# Patient Record
Sex: Female | Born: 2000 | Race: Black or African American | Hispanic: No | Marital: Single
Health system: Southern US, Community
[De-identification: ages and names within clinical notes are randomized; demographics above are authoritative.]

---
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
## 2022-05-28 ENCOUNTER — Encounter (HOSPITAL_COMMUNITY): Payer: Self-pay | Admitting: Emergency Medicine

## 2022-05-28 ENCOUNTER — Emergency Department (HOSPITAL_COMMUNITY): Payer: Medicaid Other

## 2022-05-28 ENCOUNTER — Other Ambulatory Visit: Payer: Self-pay

## 2022-05-28 ENCOUNTER — Emergency Department (HOSPITAL_COMMUNITY)
Admission: EM | Admit: 2022-05-28 | Discharge: 2022-05-28 | Disposition: A | Discharge status: Home / Self Care | Payer: Medicaid Other | Attending: Emergency Medicine | Admitting: Emergency Medicine

## 2022-05-28 DIAGNOSIS — Z23 Encounter for immunization: Secondary | ICD-10-CM | POA: Diagnosis not present

## 2022-05-28 DIAGNOSIS — M79674 Pain in right toe(s): Secondary | ICD-10-CM | POA: Diagnosis not present

## 2022-05-28 DIAGNOSIS — S99921A Unspecified injury of right foot, initial encounter: Secondary | ICD-10-CM | POA: Diagnosis present

## 2022-05-28 DIAGNOSIS — S92424A Nondisplaced fracture of distal phalanx of right great toe, initial encounter for closed fracture: Secondary | ICD-10-CM | POA: Diagnosis not present

## 2022-05-28 DIAGNOSIS — Y92410 Unspecified street and highway as the place of occurrence of the external cause: Secondary | ICD-10-CM | POA: Diagnosis not present

## 2022-05-28 DIAGNOSIS — S8992XA Unspecified injury of left lower leg, initial encounter: Secondary | ICD-10-CM | POA: Insufficient documentation

## 2022-05-28 DIAGNOSIS — S92404A Nondisplaced unspecified fracture of right great toe, initial encounter for closed fracture: Secondary | ICD-10-CM

## 2022-05-28 DIAGNOSIS — S60511A Abrasion of right hand, initial encounter: Secondary | ICD-10-CM | POA: Diagnosis not present

## 2022-05-28 MED ORDER — IBUPROFEN 400 MG PO TABS
600.0000 mg | ORAL_TABLET | Freq: Once | ORAL | Status: AC
  Administered 2022-05-28: 600 mg via ORAL
  Filled 2022-05-28: qty 1

## 2022-05-28 MED ORDER — TETANUS-DIPHTH-ACELL PERTUSSIS 5-2.5-18.5 LF-MCG/0.5 IM SUSY
0.5000 mL | PREFILLED_SYRINGE | Freq: Once | INTRAMUSCULAR | Status: AC
  Administered 2022-05-28: 0.5 mL via INTRAMUSCULAR
  Filled 2022-05-28: qty 0.5
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
## 2022-05-28 NOTE — ED Notes (Signed)
Pt provided discharge instructions and prescription information. Pt was given the opportunity to ask questions and questions were answered.   
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
## 2022-05-28 NOTE — Discharge Instructions (Addendum)
Return for any problem.   Take Ibuprofen - 600mg every 8 hours - for pain.  
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
## 2022-06-10 DEATH — deceased

## 2022-06-19 ENCOUNTER — Ambulatory Visit: Payer: Medicaid Other | Admitting: Medical
